# Patient Record
Sex: Female | Born: 1995 | Hispanic: Yes | Marital: Single | Smoking: Never smoker
Health system: Southern US, Community
[De-identification: ages and names within clinical notes are randomized; demographics above are authoritative.]

## PROBLEM LIST (undated history)

## (undated) DIAGNOSIS — N611 Abscess of the breast and nipple: Secondary | ICD-10-CM

## (undated) DIAGNOSIS — O24419 Gestational diabetes mellitus in pregnancy, unspecified control: Secondary | ICD-10-CM

## (undated) HISTORY — DX: Gestational diabetes mellitus in pregnancy, unspecified control: O24.419

## (undated) HISTORY — DX: Abscess of the breast and nipple: N61.1

---
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
## 2016-03-05 NOTE — L&D Delivery Note (Signed)
Delivery Note At 7:16 PM a viable and healthy female was delivered via Vaginal, Spontaneous Delivery (Presentation:ROA ;  ).  APGAR: 9, 9; weight pending .   Placenta status: intact, .  Cord: 3 vessel with the following complications: none  Anesthesia:  epidural Episiotomy: None Lacerations: 1st degree;Labial Suture Repair: 3.0 vicryl Est. Blood Loss (mL): 300  Mom to postpartum.  Baby to Couplet care / Skin to Skin.  Ignacia MarvelKendrick C Chinelo Benn 10/04/2016, 8:04 PM

## 2016-04-30 ENCOUNTER — Other Ambulatory Visit (INDEPENDENT_AMBULATORY_CARE_PROVIDER_SITE_OTHER): Payer: Self-pay

## 2016-04-30 DIAGNOSIS — Z3481 Encounter for supervision of other normal pregnancy, first trimester: Secondary | ICD-10-CM

## 2016-04-30 LAB — POCT URINALYSIS DIPSTICK
Bilirubin, UA: NEGATIVE
GLUCOSE UA: NEGATIVE
Nitrite, UA: NEGATIVE
Protein, UA: NEGATIVE
RBC UA: NEGATIVE
UROBILINOGEN UA: 0.2
pH, UA: 6

## 2016-04-30 LAB — POCT UA - MICROSCOPIC ONLY

## 2016-05-01 LAB — OBSTETRIC PANEL
Antibody Screen: NEGATIVE
BASOS ABS: 0 {cells}/uL (ref 0–200)
Basophils Relative: 0 %
Eosinophils Absolute: 76 cells/uL (ref 15–500)
Eosinophils Relative: 1 %
HCT: 36.5 % (ref 35.0–45.0)
HEP B S AG: NEGATIVE
Hemoglobin: 12.1 g/dL (ref 11.7–15.5)
LYMPHS ABS: 2128 {cells}/uL (ref 850–3900)
Lymphocytes Relative: 28 %
MCH: 28.3 pg (ref 27.0–33.0)
MCHC: 33.2 g/dL (ref 32.0–36.0)
MCV: 85.3 fL (ref 80.0–100.0)
MONO ABS: 608 {cells}/uL (ref 200–950)
MPV: 10.9 fL (ref 7.5–12.5)
Monocytes Relative: 8 %
NEUTROS ABS: 4788 {cells}/uL (ref 1500–7800)
NEUTROS PCT: 63 %
PLATELETS: 206 10*3/uL (ref 140–400)
RBC: 4.28 MIL/uL (ref 3.80–5.10)
RDW: 14.3 % (ref 11.0–15.0)
RH TYPE: POSITIVE
Rubella: <0.9 Index (ref <0.90)
WBC: 7.6 10*3/uL (ref 3.8–10.8)

## 2016-05-01 LAB — HIV ANTIBODY (ROUTINE TESTING W REFLEX): HIV 1&2 Ab, 4th Generation: NONREACTIVE

## 2016-05-01 LAB — CULTURE, OB URINE

## 2016-05-01 LAB — SICKLE CELL SCREEN: SICKLE CELL SCREEN: NEGATIVE

## 2016-05-07 ENCOUNTER — Encounter: Payer: Self-pay | Admitting: Family Medicine

## 2016-05-07 ENCOUNTER — Ambulatory Visit (INDEPENDENT_AMBULATORY_CARE_PROVIDER_SITE_OTHER): Payer: Self-pay | Admitting: Family Medicine

## 2016-05-07 VITALS — BP 108/68 | HR 83 | Temp 98.9°F | Wt 219.4 lb

## 2016-05-07 DIAGNOSIS — Z3400 Encounter for supervision of normal first pregnancy, unspecified trimester: Secondary | ICD-10-CM

## 2016-05-07 NOTE — Progress Notes (Signed)
     Cynthia Moore is a 21 yo G1P0 at unknown gestational age who presents for her initial prenatal visit. She is here today with her fiance.  She notes she is an [employer] patient.   Pregnancy is planned.  She reports breast tenderness, frequent urination and positive home pregnancy test. Reports she is unsure about her LMP but it may be sometime late August.  She  is taking PNV.  Started them about 3 months ago.  Takes them daily.   See flow sheet for details.  PMH: left breast abscess Surg: I+D for left breast abscess in 2015.  Allergies: pollen Medications: PNV Diet : good, well balanced  Exercise: walks around a lot, she is a Production designer, theatre/television/filmmanager at [employer]  Social: No cigarette use, no THC or other drug use.  Social etoh use.  Has not drank during this pregnancy.  Lives with her fiance.  This is her first baby. Feels safe at home.   ROS: Denies shortness of breath, leg swelling, fatigue. Denies abdominal pain.  No burning on urination.    Prenatal Exam: Gen: Well nourished, well developed.  No distress.  Vitals noted and stable.  HEENT: NCAT, MMM, EOMI, PERRL, neck supple without cervical lymphadenopathy, thyromegaly or thyroid nodules.  Fair dentition.   CV: RRR no murmur, gallops or rubs Lungs: CTAB.  Normal respiratory effort without wheezes or rales. Abd: soft, gravid, NTND. +BS.  GU: Deferred.  Ext: No edema or tenderness Psych: Normal grooming and dress.  Not depressed or anxious appearing.  Normal thought content and process without flight of ideas or looseness of associations.  Assessment & Plan: 1) 21 y.o. yo G1P0 at unknown gestational age doing well.  Current pregnancy issues include: needs dating as soon as possible to determine gestational age. Patient reports she went to a facility called [facility] and was ultrasounded.  Was told the baby is a boy but not determined how far along she is in pregnancy.  As dating is not reliable, will send patient for OB ultrasound.      Prenatal labs reviewed, notable for negative RPR, HIV and HbsAg.  GBS Neg. Rubella immune.  A POS, antibody negative.   UA with small leukocytes.  Sent for culture and <10,000 cfu noted.  Patient asymptomatic and no antibiotics necessary.  Genetic screening offered: declined. Early glucola is indicated.  PHQ-9 and Pregnancy Medical Home forms completed and reviewed.  No symptoms of depression.     -Will send patient for OB ultrasound, urgent to obtain accurate dates.  -Glucola not performed at this visit but will need to be done soon as risk factors include Hispanic origin and overweight.   Bleeding and pain precautions reviewed.   Importance of prenatal vitamins reviewed.    Follow up in 1 weeks.  Freddrick MarchYashika Gerrie Castiglia, MD [place] [facility], PGY-1

## 2016-05-07 NOTE — Patient Instructions (Signed)
It was so nice to meet you today! You were seen in clinic for your initial OB visit.  Since we were unable to accurate identify your dates of gestation, I have scheduled for you to get an OB ultrasound.  I will follow up with you once I hear from them regarding the next steps.  Please make an appointment with me to be seen in clinic again in 1 week.    Be well,   Freddrick MarchYashika Sharlot Sturkey, MD

## 2016-05-18 ENCOUNTER — Ambulatory Visit (INDEPENDENT_AMBULATORY_CARE_PROVIDER_SITE_OTHER): Payer: Self-pay | Admitting: Family Medicine

## 2016-05-18 VITALS — BP 132/82 | HR 102 | Temp 98.3°F | Wt 210.0 lb

## 2016-05-18 DIAGNOSIS — Z3480 Encounter for supervision of other normal pregnancy, unspecified trimester: Secondary | ICD-10-CM

## 2016-05-18 LAB — POCT 1 HR PRENATAL GLUCOSE: GLUCOSE 1 HR PRENATAL, POC: 155 mg/dL

## 2016-05-18 NOTE — Patient Instructions (Signed)

## 2016-05-18 NOTE — Progress Notes (Signed)
Cynthia Moore is a 21 y.o. G1P0 at 5266w0d here for routine follow up.  She reports no complaints. See flow sheet for details. Taking prenatal vitamins  A/P: Pregnancy at 5366w0d.  Doing well. Monitor weight as it appears she has lost 9lbs since her last visit (denies N/V). Fundal height and EFW ok. She had an anatomy ultrasound which showed a female with no anomalies noted.  Early 1 hour glucola 155   Pregnancy issues include elevated 1 hour glucola and late to prenatal care. Patient is not interested in genetic screening. Bleeding and pain precautions reviewed. [facility]  Would like an outpatient circumcision. F/u for 3 hr glucola.

## 2016-05-24 ENCOUNTER — Other Ambulatory Visit (INDEPENDENT_AMBULATORY_CARE_PROVIDER_SITE_OTHER): Payer: Self-pay

## 2016-05-24 DIAGNOSIS — Z3482 Encounter for supervision of other normal pregnancy, second trimester: Secondary | ICD-10-CM

## 2016-05-24 LAB — GLUCOSE, POCT (MANUAL RESULT ENTRY): POC Glucose: 99 mg/dl (ref 70–99)

## 2016-05-25 LAB — GESTATIONAL GLUCOSE TOLERANCE
GLUCOSE 1 HOUR GTT: 143 mg/dL (ref 65–179)
GLUCOSE 2 HOUR GTT: 132 mg/dL (ref 65–154)
Glucose, Fasting: 81 mg/dL (ref 65–94)
Glucose, GTT - 3 Hour: 111 mg/dL (ref 65–139)

## 2016-06-06 ENCOUNTER — Encounter: Payer: Self-pay | Admitting: Family Medicine

## 2016-06-28 NOTE — Progress Notes (Deleted)
Cynthia Moore is a 21 y.o. G1P0 at [redacted]w[redacted]d here for routine follow up.  She reports ***. See flow sheet for details.  A/P: Pregnancy at [redacted]w[redacted]d.  Doing well.   Weight today ***.  Fundal height and EFW good.  Pregnancy issues include late to prenatal care and elevated 1 hour glucola with normal early 3 hour GTT. -Will need a one hour glucola today.  Fundal height and EFW good ***.   Preterm labor and fetal movement precautions reviewed.  Follow up *** weeks.

## 2016-07-16 ENCOUNTER — Ambulatory Visit (INDEPENDENT_AMBULATORY_CARE_PROVIDER_SITE_OTHER): Payer: Self-pay | Admitting: Family Medicine

## 2016-07-16 VITALS — BP 120/76 | HR 106 | Temp 98.3°F | Wt 221.2 lb

## 2016-07-16 DIAGNOSIS — Z3400 Encounter for supervision of normal first pregnancy, unspecified trimester: Secondary | ICD-10-CM

## 2016-07-16 MED ORDER — TETANUS-DIPHTH-ACELL PERTUSSIS 5-2.5-18.5 LF-MCG/0.5 IM SUSP: 0.5000 mL | Freq: Once | INTRAMUSCULAR | 0 refills | Status: AC

## 2016-07-16 NOTE — Progress Notes (Signed)
[name] is a 21 y.o. G1P0 at 702w3d here for routine follow up.  She denies vaginal bleeding, discharge, leakage of fluid or abdominal pain.  She feels baby moving.   See flow sheet for details.  A/P: Pregnancy at 262w3d.  Doing well.  She is present today with her family members.  Pregnancy issues include late to prenatal care.  Infant feeding choice: breastfeeding only  Contraception choice: considering Nexplanon  Infant circumcision desired:  Yes, outpatient   Tdap was not given today.  As she is [employer], provided patient with a paper Rx and she will get it done at pharmacy.   CBC, RPR, and HIV done today.   Patient to return for 1-hour glucola as this was not done at today's visit due to time constraint.  Pregnancy medical home and PHQ-9 forms were done today and reviewed.  No concerns on either form.  Rh status was reviewed and patient does not need Rhogam.  Rhogam was not given today.   Childbirth and education classes were offered. Preterm labor and fetal movement precautions reviewed. Patient to make appointment with [facility] as she missed her last appointment with them.  Follow up 2 weeks.

## 2016-07-16 NOTE — Patient Instructions (Addendum)
It was nice seeing you again today.  You were seen in clinic for a prenatal visit and are doing great.  Your baby's heart rate was 152. You had some blood work done today as well and I will follow up with you with the results.    You will need to come back to the lab for your 1 hour glucose test as we were unable to do it during today's visit.   Please schedule an appointment with the OB clinic in 2 weeks for your next visit.   You can make this appointment at the front desk.    Be well,  Freddrick MarchYashika Nathanal Hermiz, MD

## 2016-07-17 LAB — CBC
Hematocrit: 34.2 % (ref 34.0–46.6)
Hemoglobin: 11.5 g/dL (ref 11.1–15.9)
MCH: 29.5 pg (ref 26.6–33.0)
MCHC: 33.6 g/dL (ref 31.5–35.7)
MCV: 88 fL (ref 79–97)
Platelets: 216 10*3/uL (ref 150–379)
RBC: 3.9 x10E6/uL (ref 3.77–5.28)
RDW: 14.3 % (ref 12.3–15.4)
WBC: 9.1 10*3/uL (ref 3.4–10.8)

## 2016-07-17 LAB — RPR: RPR Ser Ql: NONREACTIVE

## 2016-07-17 LAB — HIV ANTIBODY (ROUTINE TESTING W REFLEX): HIV Screen 4th Generation wRfx: NONREACTIVE

## 2016-07-27 ENCOUNTER — Encounter: Payer: Self-pay | Admitting: Family Medicine

## 2016-07-27 ENCOUNTER — Telehealth: Payer: Self-pay | Admitting: Family Medicine

## 2016-07-27 ENCOUNTER — Other Ambulatory Visit (INDEPENDENT_AMBULATORY_CARE_PROVIDER_SITE_OTHER): Payer: Self-pay

## 2016-07-27 DIAGNOSIS — O24419 Gestational diabetes mellitus in pregnancy, unspecified control: Secondary | ICD-10-CM

## 2016-07-27 DIAGNOSIS — O0990 Supervision of high risk pregnancy, unspecified, unspecified trimester: Secondary | ICD-10-CM | POA: Insufficient documentation

## 2016-07-27 DIAGNOSIS — Z3400 Encounter for supervision of normal first pregnancy, unspecified trimester: Secondary | ICD-10-CM

## 2016-07-27 HISTORY — DX: Gestational diabetes mellitus in pregnancy, unspecified control: O24.419

## 2016-07-27 LAB — POCT 1 HR PRENATAL GLUCOSE: Glucose 1 Hr Prenatal, POC: 213 mg/dL

## 2016-07-27 LAB — POCT GLYCOSYLATED HEMOGLOBIN (HGB A1C): HEMOGLOBIN A1C: 5.4

## 2016-07-27 NOTE — Progress Notes (Signed)
?????? ??? ??   5? 4?? ?? ?? ?? ????? ?? ??? ????.  ?? ? ?? ???????? ???????? ??? ?? ???? ?????. ???? ?? ???! ???

## 2016-07-27 NOTE — Telephone Encounter (Signed)
Patient came in today for her 1 hour GTT, which was markedly elevated at 213. As a faculty physician, I was contacted to manage this result while patient was still present in the clinic. Her GTT is diagnostic of gestational diabetes. No indications of preexisting diabetes as she had a normal 3hr GTT earlier this pregnancy. We also did an A1c today, which was 5.4.  I met with patient and discussed dx of GDM, and need for her to transfer her care to high risk OB clinic at [facility]. I gave her some printed information on gestational diabetes to read over. I will enter an urgent referral now. She was advised to continue coming to appts here at the [facility] until she gets in at [facility], so we can be sure she continues to get prenatal care in the interim. She denies cramping/ctx, fluid leaking, vaginal bleeding, or decreased fetal movement.    All questions answered. FYI to Dr. Reesa Chew, primary prenatal provider.  Leeanne Rio, MD

## 2016-08-02 ENCOUNTER — Ambulatory Visit (INDEPENDENT_AMBULATORY_CARE_PROVIDER_SITE_OTHER): Payer: Self-pay | Admitting: Family Medicine

## 2016-08-02 VITALS — BP 110/60 | HR 108 | Temp 98.2°F | Wt 219.0 lb

## 2016-08-02 DIAGNOSIS — Z3400 Encounter for supervision of normal first pregnancy, unspecified trimester: Secondary | ICD-10-CM

## 2016-08-02 NOTE — Progress Notes (Signed)
Cynthia Moore is a 21 y.o. G1P0 at 139w6d here for routine follow up.  She denies vaginal discharge, leakage of fluids and bleeding. Feels baby moving.  Denies cramping and contractions.    See flow sheet for details.  A/P: Pregnancy at 709w6d.  Doing well.   She is present at today's visit with her sister.  Pregnancy issues include late to prenatal care, recently diagnosed gestational diabetes.   Of note, patient had come in for her 1 hour GTT last week which was elevated to 213, diagnostic of gestational diabetes.  Dr. Pollie MeyerMcIntyre had seen her for this as she was present in clinic, and ordered A1c which was 5.4%.  Her diagnosis was discussed and she was referred to [facility] for [facility].  She will [facility] to be [facility] in the interim until she can get [facility].  Referral had been placed and patient also given the phone number to call today.    Infant feeding choice: breastfeeding  Contraception choice: Nexplanon  Infant circumcision desired: yes, outpatient   Tdap : She was given a paper prescription for Tdap at last visit and has received the shot.   Preterm labor and fetal movement precautions reviewed. Safe sleep discussed. Patient is to make appointment with high [facility], however will [facility] in the interim while waiting to establish care.   Follow up in 2 weeks.

## 2016-08-02 NOTE — Patient Instructions (Signed)
It was great seeing you again! You were seen in clinic for your 30 week OB appointment and are doing great.  Your baby's heart rate is 155-160 and your labs from last visit were all normal with the exception of your blood sugar in your 1-hour glucose tolerance test.  You were given the number to call for an appointment at the [facility] for your prenatal care given your diagnosis of gestational diabetes.  I am attaching information about this condition below for you in case you have any questions about it.  In the meantime I would advise you to make your next appointment here with me and we can cancel that if you are able to be seen at [facility].     Be well,  Freddrick March, MD    Gestational Diabetes Mellitus, Diagnosis Gestational diabetes (gestational diabetes mellitus) is a short-term (temporary) form of diabetes that can happen during pregnancy. It goes away after you give birth. It may be caused by one or both of these problems:  Your body does not make enough of a hormone called insulin.  Your body does not respond in a normal way to insulin that it makes.  Insulin lets sugars (glucose) go into cells in the body. This gives you energy. If you have diabetes, sugars cannot get into cells. This causes high blood sugar (hyperglycemia). If diabetes is treated, it may not hurt you or your baby. Your doctor will set treatment goals for you. In general, you should have these blood sugar levels:  After not eating for a long time (fasting): 95 mg/dL (5.3 mmol/L).  After meals (postprandial): ? One hour after a meal: at or below 140 mg/dL (7.8 mmol/L). ? Two hours after a meal: at or below 120 mg/dL (6.7 mmol/L).  A1c (hemoglobin A1c) level: 6-6.5%.  Follow these instructions at home: Questions to Ask Your Doctor  You may want to ask these questions:  Do I need to meet with a diabetes educator?  Where can I find a support group for people with diabetes?  What equipment will  I need to care for myself at home?  What diabetes medicines do I need? When should I take them?  How often do I need to check my blood sugar?  What number can I call if I have questions?  When is my next doctor's visit?  General instructions  Take over-the-counter and prescription medicines only as told by your doctor.  Stay at a healthy weight during pregnancy.  Keep all follow-up visits as told by your doctor. This is important. Contact a doctor if:  Your blood sugar is at or above 240 mg/dL (16.1 mmol/L).  Your blood sugar is at or above 200 mg/dL (09.6 mmol/L) and you have ketones in your pee (urine).  You have been sick or have had a fever for 2 days or more and you are not getting better.  You have any of these problems for more than 6 hours: ? You cannot eat or drink. ? You feel sick to your stomach (nauseous). ? You throw up (vomit). ? You have watery poop (diarrhea). Get help right away if:  Your blood sugar is lower than 54 mg/dL (3 mmol/L).  You get confused.  You have trouble: ? Thinking clearly. ? Breathing.  Your baby moves less than normal.  You have: ? Moderate or large ketone levels in your pee (urine). ? Bleeding from your vagina. ? Unusual fluid coming from your vagina. ? Early contractions.  These may feel like tightness in your belly. This information is not intended to replace advice given to you by your health care provider. Make sure you discuss any questions you have with your health care provider. Document Released: 06/13/2015 Document Revised: 07/28/2015 Document Reviewed: 03/25/2015 Elsevier Interactive Patient Education  [employer]2018 [employer] Inc[employer]

## 2016-08-16 ENCOUNTER — Encounter: Payer: No Typology Code available for payment source | Admitting: Family Medicine

## 2016-08-30 ENCOUNTER — Other Ambulatory Visit: Payer: No Typology Code available for payment source

## 2016-09-04 ENCOUNTER — Encounter: Payer: Self-pay | Admitting: Obstetrics and Gynecology

## 2016-09-04 NOTE — Progress Notes (Deleted)
@  NAME@ is a @AGE @ @GP @ at @GA @ here for routine follow up.  She reports ***. See flow sheet for details.  A/P: Pregnancy at @GA @.  Doing well.   Pregnancy issues include ***.  Infant feeding choice: *** Contraception choice: *** Infant circumcision desired: {Response; yes/no/na:63}  Tdap {was/was WUJ:81191}not:19854} given today. GBS and gc/chlamydia testing {was/was not:19854} performed today.  Preterm labor and fetal movement precautions reviewed. Safe sleep discussed. Follow up {1/2:30633} week(s).  Caryl AdaJazma Phelps, DO 09/04/2016, 9:21 AM

## 2016-09-11 ENCOUNTER — Ambulatory Visit (INDEPENDENT_AMBULATORY_CARE_PROVIDER_SITE_OTHER): Payer: Self-pay | Admitting: Family Medicine

## 2016-09-11 ENCOUNTER — Other Ambulatory Visit (HOSPITAL_COMMUNITY)
Admission: RE | Admit: 2016-09-11 | Payer: No Typology Code available for payment source | Source: Ambulatory Visit | Admitting: Family Medicine

## 2016-09-11 VITALS — BP 108/80 | HR 112 | Temp 98.1°F | Wt 226.0 lb

## 2016-09-11 DIAGNOSIS — Z3403 Encounter for supervision of normal first pregnancy, third trimester: Secondary | ICD-10-CM

## 2016-09-11 DIAGNOSIS — O24419 Gestational diabetes mellitus in pregnancy, unspecified control: Secondary | ICD-10-CM

## 2016-09-11 LAB — GLUCOSE, POCT (MANUAL RESULT ENTRY): POC Glucose: 120 mg/dl — AB (ref 70–99)

## 2016-09-11 NOTE — Progress Notes (Signed)
[name] is a 21 y.o. G1P0 at 745w4d here for routine follow up.  She reports cramping in the evenings, started at 9pm x 1.5 hours constant. "Not good with pain." Discharge - white gooey discharge with every wipe. Noted one brown episode of discharge 2 days ago. No blood. Baby is moving well.   See flow sheet for details.  A/P: Pregnancy at 3245w4d.  Doing well.   Pregnancy issues include gestational diabetes - [facility].  Infant feeding choice: breastfeeding  Contraception choice: Nexplanon Infant circumcision desired: yes  Tdap was not given today. Already given.  GBS and gc/chlamydia testing was performed today. Discharge - on exam, physiologic discharge of pregnancy.  GDM - patient not scheduled until 7/19. [facility]eds [facility], needing diabetic teaching and CBG monitoring. POCT CBG today 120 3.5 hours after eating. Discussed with Dr. Adrian BlackwaterStinson who [facility] schedule her an appt for tomorrow for teaching and OB visit Thursday. Given one touch glucometer, 10 lancets, and 10 strips today. RN completed teaching for CBG checks.   Preterm labor and fetal movement precautions reviewed. Safe sleep discussed. Follow up with [facility].

## 2016-09-12 ENCOUNTER — Ambulatory Visit (INDEPENDENT_AMBULATORY_CARE_PROVIDER_SITE_OTHER): Payer: Self-pay | Admitting: Obstetrics and Gynecology

## 2016-09-12 ENCOUNTER — Encounter: Payer: Self-pay | Admitting: Obstetrics and Gynecology

## 2016-09-12 VITALS — BP 121/72 | HR 79 | Wt 225.0 lb

## 2016-09-12 DIAGNOSIS — O99213 Obesity complicating pregnancy, third trimester: Secondary | ICD-10-CM | POA: Insufficient documentation

## 2016-09-12 DIAGNOSIS — O0990 Supervision of high risk pregnancy, unspecified, unspecified trimester: Secondary | ICD-10-CM

## 2016-09-12 DIAGNOSIS — Z113 Encounter for screening for infections with a predominantly sexual mode of transmission: Secondary | ICD-10-CM

## 2016-09-12 DIAGNOSIS — O24419 Gestational diabetes mellitus in pregnancy, unspecified control: Secondary | ICD-10-CM

## 2016-09-12 DIAGNOSIS — E669 Obesity, unspecified: Secondary | ICD-10-CM | POA: Insufficient documentation

## 2016-09-12 DIAGNOSIS — O0993 Supervision of high risk pregnancy, unspecified, third trimester: Secondary | ICD-10-CM

## 2016-09-12 NOTE — Progress Notes (Signed)
Prenatal Visit Note Date: 09/12/2016 Clinic: [facility] 09/12/2016  CC: transfer of care for GDM  Subjective:  Cynthia Moore is a 21 y.o. G1 at 1330w5d being seen today for ongoing prenatal care.  She is currently monitored for the following issues for this high-risk pregnancy and has Gestational diabetes mellitus (GDM); Supervision of high risk pregnancy, antepartum; Obesity in pregnancy, antepartum, third trimester; and Obesity (BMI 30-39.9) on her problem list.  Patient reports no complaints.   Contractions: Not present. Vag. Bleeding: None.  Movement: Present. Denies leaking of fluid.   The following portions of the patient's history were reviewed and updated as appropriate: allergies, current medications, past family history, past medical history, past social history, past surgical history and problem list. Problem list updated.  Objective:   Vitals:   09/12/16 1353  BP: 121/72  Pulse: 79  Weight: 225 lb (102.1 kg)    Fetal Status: Fetal Heart Rate (bpm): 145 Fundal Height: 37 cm Movement: Present  Presentation: Vertex  General:  Alert, oriented and cooperative. Patient is in no acute distress.  Skin: Skin is warm and dry. No rash noted.   Cardiovascular: Normal heart rate noted  Respiratory: Normal respiratory effort, no problems with respiration noted  Abdomen: Soft, gravid, appropriate for gestational age. Pain/Pressure: Present     Pelvic:  Cervical exam deferred        Extremities: Normal range of motion.  Edema: Trace  Mental Status: Normal mood and affect. Normal behavior. Normal judgment and thought content.   Urinalysis:      Assessment and Plan:  Pregnancy: G1P0 at 1330w5d  1. Supervision of high risk pregnancy, antepartum Routine care. F/u GBS from yesterday. D/w pt re: BC nv.  - GC/Chlamydia probe amp ([place])not at [facility]  2. Gestational diabetes mellitus (GDM), antepartum, gestational diabetes method of control unspecified Pt  diagnosed in late May; see ROB from Family Medicine yesterday. Only BS value is a 2hr PP of 113. a1c back at dx was 5.3. Pt has education appt next; pt given tips today and told to keep a food log and bring it to visit next week. Surveillance growth u/s ordered - US MFM OB DETAIL +14 WK; Future  Preterm labor symptoms and general obstetric precautions including but not limited to vaginal bleeding, contractions, leaking of fluid and fetal movement were reviewed in detail with the patient. Please refer to After Visit Summary for other counseling recommendations.  Return in about 1 week (around 09/19/2016) for 7-10d rob.   Cynthia BingPickens, Emmabelle Fear, MD

## 2016-09-13 ENCOUNTER — Ambulatory Visit: Payer: Self-pay | Admitting: *Deleted

## 2016-09-13 ENCOUNTER — Other Ambulatory Visit: Payer: Self-pay | Admitting: *Deleted

## 2016-09-13 ENCOUNTER — Encounter: Payer: No Typology Code available for payment source | Attending: Obstetrics and Gynecology | Admitting: *Deleted

## 2016-09-13 DIAGNOSIS — O24410 Gestational diabetes mellitus in pregnancy, diet controlled: Secondary | ICD-10-CM | POA: Insufficient documentation

## 2016-09-13 DIAGNOSIS — Z3A00 Weeks of gestation of pregnancy not specified: Secondary | ICD-10-CM | POA: Insufficient documentation

## 2016-09-13 DIAGNOSIS — Z713 Dietary counseling and surveillance: Secondary | ICD-10-CM | POA: Insufficient documentation

## 2016-09-13 LAB — GC/CHLAMYDIA PROBE AMP (~~LOC~~) NOT AT ARMC
Chlamydia: NEGATIVE
NEISSERIA GONORRHEA: NEGATIVE

## 2016-09-13 LAB — CERVICOVAGINAL ANCILLARY ONLY
CHLAMYDIA, DNA PROBE: NEGATIVE
NEISSERIA GONORRHEA: NEGATIVE

## 2016-09-13 NOTE — Progress Notes (Signed)
  Patient was seen on 09/13/2016 for Gestational Diabetes self-management. Patient was given One Touch Verio meter 2 days ago but does not have insurance to cover cost of supplies. She has been testing and states her FBG today was 80 and post meal after lunch yesterday was 113 mg/dl. Diet history indicated high carb meals until diagnosis of GDM.  The following learning objectives were met by the patient :   States the definition of Gestational Diabetes  States why dietary management is important in controlling blood glucose  Describes the effects of carbohydrates on blood glucose levels  Demonstrates ability to create a balanced meal plan  Demonstrates carbohydrate counting   States when to check blood glucose levels  Demonstrates proper blood glucose monitoring techniques  States the effect of stress and exercise on blood glucose levels  States the importance of limiting caffeine and abstaining from alcohol and smoking  Plan:  Aim for 3 Carb Choices per meal (45 grams) +/- 1 either way  Aim for 1-2 Carbs per snack Begin reading food labels for Total Carbohydrate of foods Consider  increasing your activity level by walking or other activity daily as tolerated Begin checking BG before breakfast and 2 hours after first bite of breakfast, lunch and dinner as directed by MD  Bring Log Book to every medical appointment   Take medication if directed by MD  Blood glucose monitor given: True Track Lot # [id number] Exp: 06/23/2018 Blood glucose reading: not done today as she is already testing and all results so far within target ranges.   Patient instructed to monitor glucose levels: FBS: 60 - 95 mg/dl 2 hour: <120 mg/dl  Patient received the following handouts:  Nutrition Diabetes and Pregnancy  Carbohydrate Counting List  Patient will be seen for follow-up as needed.

## 2016-09-14 ENCOUNTER — Encounter: Payer: Self-pay | Admitting: *Deleted

## 2016-09-14 NOTE — Progress Notes (Signed)
Addendum: 09/14/16 10:40:  FMLA paperwork completed.

## 2016-09-15 LAB — CULTURE, BETA STREP (GROUP B ONLY): STREP GP B CULTURE: NEGATIVE

## 2016-09-17 ENCOUNTER — Other Ambulatory Visit: Payer: Self-pay | Admitting: Obstetrics and Gynecology

## 2016-09-17 ENCOUNTER — Ambulatory Visit (HOSPITAL_COMMUNITY)
Admission: RE | Admit: 2016-09-17 | Discharge: 2016-09-17 | Disposition: A | Discharge status: Home / Self Care | Payer: No Typology Code available for payment source | Source: Ambulatory Visit | Attending: Obstetrics and Gynecology | Admitting: Obstetrics and Gynecology

## 2016-09-17 ENCOUNTER — Encounter (HOSPITAL_COMMUNITY): Payer: Self-pay

## 2016-09-17 DIAGNOSIS — Z363 Encounter for antenatal screening for malformations: Secondary | ICD-10-CM

## 2016-09-17 DIAGNOSIS — O99213 Obesity complicating pregnancy, third trimester: Secondary | ICD-10-CM

## 2016-09-17 DIAGNOSIS — O24419 Gestational diabetes mellitus in pregnancy, unspecified control: Secondary | ICD-10-CM

## 2016-09-17 DIAGNOSIS — O24410 Gestational diabetes mellitus in pregnancy, diet controlled: Secondary | ICD-10-CM | POA: Insufficient documentation

## 2016-09-17 DIAGNOSIS — Z3A37 37 weeks gestation of pregnancy: Secondary | ICD-10-CM

## 2016-09-19 ENCOUNTER — Ambulatory Visit (INDEPENDENT_AMBULATORY_CARE_PROVIDER_SITE_OTHER): Payer: Self-pay | Admitting: Obstetrics & Gynecology

## 2016-09-19 VITALS — BP 113/64 | HR 92 | Wt 226.4 lb

## 2016-09-19 DIAGNOSIS — O0993 Supervision of high risk pregnancy, unspecified, third trimester: Secondary | ICD-10-CM

## 2016-09-19 DIAGNOSIS — O24410 Gestational diabetes mellitus in pregnancy, diet controlled: Secondary | ICD-10-CM

## 2016-09-19 DIAGNOSIS — O0990 Supervision of high risk pregnancy, unspecified, unspecified trimester: Secondary | ICD-10-CM

## 2016-09-19 LAB — POCT URINALYSIS DIP (DEVICE)
BILIRUBIN URINE: NEGATIVE
Glucose, UA: NEGATIVE mg/dL
HGB URINE DIPSTICK: NEGATIVE
NITRITE: NEGATIVE
Protein, ur: 30 mg/dL — AB
Specific Gravity, Urine: 1.025 (ref 1.005–1.030)
Urobilinogen, UA: 0.2 mg/dL (ref 0.0–1.0)
pH: 7 (ref 5.0–8.0)

## 2016-09-19 NOTE — Progress Notes (Signed)
PRENATAL VISIT NOTE  Subjective:  Cynthia Moore is a 21 y.o. G1P0 at 6627w5d being seen today for ongoing prenatal care.  She is currently monitored for the following issues for this high-risk pregnancy and has Gestational diabetes mellitus (GDM); Supervision of high risk pregnancy, antepartum; Obesity in pregnancy, antepartum, third trimester; and Obesity (BMI 30-39.9) on her problem list.  Patient reports no complaints.  Contractions: Not present. Vag. Bleeding: None.  Movement: Present. Denies leaking of fluid.   The following portions of the patient's history were reviewed and updated as appropriate: allergies, current medications, past family history, past medical history, past social history, past surgical history and problem list. Problem list updated.  Objective:   Vitals:   09/19/16 1251  BP: 113/64  Pulse: 92  Weight: 226 lb 6.4 oz (102.7 kg)    Fetal Status: Fetal Heart Rate (bpm): 135 Fundal Height: 39 cm Movement: Present     General:  Alert, oriented and cooperative. Patient is in no acute distress.  Skin: Skin is warm and dry. No rash noted.   Cardiovascular: Normal heart rate noted  Respiratory: Normal respiratory effort, no problems with respiration noted  Abdomen: Soft, gravid, appropriate for gestational age.  Pain/Pressure: Present     Pelvic: Cervical exam deferred        Extremities: Normal range of motion.  Edema: None  Mental Status:  Normal mood and affect. Normal behavior. Normal judgment and thought content.   [place] Mfm Ob Detail +14 Wk  Result Date: 09/17/2016 ----------------------------------------------------------------------  OBSTETRICS REPORT                      (Signed Final 09/17/2016 05:43 pm) ---------------------------------------------------------------------- Patient Info  ID #:       [id number]                         D.O.B.:   Feb 12, 1996 (20 yrs)  Name:       Cynthia Moore                 Visit Date:  09/17/2016 10:29 am  ---------------------------------------------------------------------- Performed By  Performed By:     Tommie RaymondMesha Tester BS,       Secondary Phy.:   CHARLIE                    RDMS, RVT                                                             Vergie LivingPICKENS MD  Attending:        Particia NearingMartha Decker MD       Address:          [address]                                                             [address]  [place], [place]                                                             [address]  Referred By:      [facility]       Location:         [facility]                    [facility]                    Healthcare  Ref. Address:     [facility]                    [address]                    [place], [place]                    [address] ---------------------------------------------------------------------- Orders   #  Description                                 Code   1  [place] MFM OB DETAIL +14 WK                     82956.21  ----------------------------------------------------------------------   #  Ordered By               Order #        Accession #    Episode #   1   Bing          [id number]      [id number]     [id number]  ---------------------------------------------------------------------- Indications   [redacted] weeks gestation of pregnancy                Z3A.37   Obesity complicating pregnancy, third          O99.213   trimester   Antenatal screening for malformations          Z36.3   Gestational diabetes in pregnancy, diet        O24.410   controlled  ---------------------------------------------------------------------- OB History  Blood Type:            Height:  5'4"   Weight (lb):  227      BMI:   38.96  Gravidity:    1         Term:   0        Prem:   0        SAB:   0  TOP:          0       Ectopic:  0        Living: 0  ---------------------------------------------------------------------- Fetal Evaluation  Num Of Fetuses:     1  Fetal  Heart         171  Rate(bpm):  Cardiac Activity:   Observed  Presentation:       Cephalic  Placenta:           Anterior, above cervical os  P. Cord Insertion:  Visualized, central  Amniotic Fluid  AFI FV:      Subjectively within normal limits  AFI Sum(cm)     %Tile       Largest Pocket(cm)  16.59           63          5.26  RUQ(cm)       RLQ(cm)       LUQ(cm)        LLQ(cm)  5.26          3.37          4.14           3.82 ---------------------------------------------------------------------- Biometry  BPD:      97.7  mm     G. Age:  40w 0d       > 99  %    CI:        84.46   %   70 - 86                                                          FL/HC:      20.5   %   20.8 - 22.6  HC:       335   mm     G. Age:  38w 2d         49  %    HC/AC:      0.97       0.92 - 1.05  AC:      343.9  mm     G. Age:  38w 2d         84  %    FL/BPD:     70.2   %   71 - 87  FL:       68.6  mm     G. Age:  35w 1d          8  %    FL/AC:      19.9   %   20 - 24  HUM:      61.8  mm     G. Age:  35w 5d         43  %  Est. FW:    3312  gm      7 lb 5 oz     79  % ---------------------------------------------------------------------- Gestational Age  Clinical EDD:  37w 3d                                        EDD:   10/05/16  U/S Today:     38w 0d                                        EDD:   10/01/16  Best:  37w 3d    Det. By:   Previous Ultrasound      EDD:   10/05/16                                      (05/14/16) ---------------------------------------------------------------------- Anatomy  Cranium:               Appears normal         Aortic Arch:            Not well visualized  Cavum:                 Appears normal         Ductal Arch:            Not well visualized  Ventricles:            Appears normal         Diaphragm:              Appears normal  Choroid Plexus:        Appears normal         Stomach:                 Appears normal, left                                                                        sided  Cerebellum:            Not well visualized    Abdomen:                Appears normal  Posterior Fossa:       Not well visualized    Abdominal Wall:         Not well visualized  Nuchal Fold:           Not applicable (>20    Cord Vessels:           Appears normal ([redacted]                         wks GA)                                        vessel cord)  Face:                  Orbits nl; profile not Kidneys:                Appear normal                         well visualized  Lips:                  Appears normal         Bladder:                Appears normal  Thoracic:              Appears normal  Spine:                  Limited views                                                                        appear normal  Heart:                 Appears normal         Upper Extremities:      Visualized                         (4CH, axis, and situs  RVOT:                  Appears normal         Lower Extremities:      Visualized  LVOT:                  Appears normal  Other:  Fetus appears to be a female. Technically difficult due to advanced GA          and fetal position. Technically difficult due to maternal habitus. ---------------------------------------------------------------------- Cervix Uterus Adnexa  Cervix  Not visualized (advanced GA >29wks)  Uterus  No abnormality visualized.  Left Ovary  Size(cm)     2.71  x    2.84   x  1.68      Vol(ml): 6.8  Within normal limits. No adnexal mass visualized.  Right Ovary  Size(cm)     2.79  x    2.31   x  2.58      Vol(ml): 8.7  Within normal limits. No adnexal mass visualized.  Cul De Sac:   No free fluid seen.  Adnexa:       No abnormality visualized. ---------------------------------------------------------------------- Impression  SIUP at 37+3 weeks  Cephalic presentation  Normal detailed fetal anatomy; limited views of posterior  fossa, profile, arches and  spine  Normal amniotic fluid volume  Measurements consistent with prior US; EFW at the 79th  %tile ---------------------------------------------------------------------- Recommendations  Follow-up as clinically indicated ----------------------------------------------------------------------                 Particia Nearing, MD Electronically Signed Final Report   09/17/2016 05:43 pm ----------------------------------------------------------------------  Results for orders placed or performed in visit on 09/19/16 (from the past 336 hour(s))  POCT urinalysis dip (device)   Collection Time: 09/19/16 12:57 PM  Result Value Ref Range   Glucose, UA NEGATIVE NEGATIVE mg/dL   Bilirubin Urine NEGATIVE NEGATIVE   Ketones, ur TRACE (A) NEGATIVE mg/dL   Specific Gravity, Urine 1.025 1.005 - 1.030   Hgb urine dipstick NEGATIVE NEGATIVE   pH 7.0 5.0 - 8.0   Protein, ur 30 (A) NEGATIVE mg/dL   Urobilinogen, UA 0.2 0.0 - 1.0 mg/dL   Nitrite NEGATIVE NEGATIVE   Leukocytes, UA SMALL (A) NEGATIVE  Results for orders placed or performed in visit on 09/12/16 (from the past 336 hour(s))  GC/Chlamydia probe amp ([place])not at [facility]   Collection Time: 09/12/16 12:00 AM  Result Value Ref Range   Chlamydia Negative    Neisseria gonorrhea Negative   Results for orders placed or performed in  visit on 09/11/16 (from the past 336 hour(s))  Cervicovaginal ancillary only   Collection Time: 09/11/16 12:00 AM  Result Value Ref Range   Chlamydia Negative    Neisseria gonorrhea Negative   Glucose (CBG)   Collection Time: 09/11/16  3:28 PM  Result Value Ref Range   POC Glucose 120 (A) 70 - 99 mg/dl  Culture, beta strep (group b only)   Collection Time: 09/11/16  3:56 PM  Result Value Ref Range   Strep Gp B Culture Negative Negative     Assessment and Plan:  Pregnancy: G1P0 at [redacted]w[redacted]d  1. Diet controlled gestational diabetes mellitus (GDM) in third trimester Stable blood sugars, continue diet and exercise. EFW  79%.    2. Supervision of high risk pregnancy, antepartum Term labor symptoms and general obstetric precautions including but not limited to vaginal bleeding, contractions, leaking of fluid and fetal movement were reviewed in detail with the patient. Please refer to After Visit Summary for other counseling recommendations.  Return in about 1 week (around 09/26/2016) for OB Visit.   Jaynie Collins, MD

## 2016-09-19 NOTE — Patient Instructions (Signed)
Regrese a la clinica cuando tenga su cita. Si tiene problemas o preguntas, llama a la clinica o vaya a la sala de emergencia al [facility].    

## 2016-09-20 ENCOUNTER — Encounter (HOSPITAL_COMMUNITY): Payer: Self-pay

## 2016-09-20 ENCOUNTER — Other Ambulatory Visit: Payer: No Typology Code available for payment source

## 2016-09-26 ENCOUNTER — Telehealth (HOSPITAL_COMMUNITY): Payer: Self-pay | Admitting: *Deleted

## 2016-09-26 ENCOUNTER — Ambulatory Visit (INDEPENDENT_AMBULATORY_CARE_PROVIDER_SITE_OTHER): Payer: Self-pay | Admitting: Obstetrics and Gynecology

## 2016-09-26 VITALS — BP 104/64 | HR 76 | Wt 230.0 lb

## 2016-09-26 DIAGNOSIS — E669 Obesity, unspecified: Secondary | ICD-10-CM

## 2016-09-26 DIAGNOSIS — O0993 Supervision of high risk pregnancy, unspecified, third trimester: Secondary | ICD-10-CM

## 2016-09-26 DIAGNOSIS — O99213 Obesity complicating pregnancy, third trimester: Secondary | ICD-10-CM

## 2016-09-26 DIAGNOSIS — O0990 Supervision of high risk pregnancy, unspecified, unspecified trimester: Secondary | ICD-10-CM

## 2016-09-26 DIAGNOSIS — O24410 Gestational diabetes mellitus in pregnancy, diet controlled: Secondary | ICD-10-CM

## 2016-09-26 NOTE — Progress Notes (Signed)
Prenatal Visit Note Date: 09/26/2016 Clinic: [facility]  Subjective:  Cynthia Moore is a 21 y.o. G1P0 at 3619w5d being seen today for ongoing prenatal care.  She is currently monitored for the following issues for this high-risk pregnancy and has Gestational diabetes mellitus (GDM); Supervision of high risk pregnancy, antepartum; Obesity in pregnancy, antepartum, third trimester; and Obesity (BMI 30-39.9) on her problem list.  Patient reports no complaints.   Contractions: Irregular. Vag. Bleeding: None.  Movement: Present. Denies leaking of fluid.   The following portions of the patient's history were reviewed and updated as appropriate: allergies, current medications, past family history, past medical history, past social history, past surgical history and problem list. Problem list updated.  Objective:   Vitals:   09/26/16 1252  BP: 104/64  Pulse: 76  Weight: 230 lb (104.3 kg)    Fetal Status: Fetal Heart Rate (bpm): 150 Fundal Height: 39 cm Movement: Present  Presentation: Vertex  General:  Alert, oriented and cooperative. Patient is in no acute distress.  Skin: Skin is warm and dry. No rash noted.   Cardiovascular: Normal heart rate noted  Respiratory: Normal respiratory effort, no problems with respiration noted  Abdomen: Soft, gravid, appropriate for gestational age. Pain/Pressure: Present     Pelvic:  Cervical exam deferred Dilation: 1 Effacement (%): 50 Station: -3/posterior/intermediate Pelvis feels adequate  Extremities: Normal range of motion.  Edema: Trace  Mental Status: Normal mood and affect. Normal behavior. Normal judgment and thought content.   Urinalysis:      Assessment and Plan:  Pregnancy: G1P0 at 2319w5d  1. Obesity (BMI 30-39.9) No issues  2. Obesity in pregnancy, antepartum, third trimester No issues  3. Supervision of high risk pregnancy, antepartum Routine care. GBS neg  4. Diet controlled gestational diabetes mellitus  (GDM) in third trimester Normal BS log today. 7/16: efw 79%, 3312gm, AC 84% BPD >99% with normal AFI, ceph. Set up for IOL at 7am at 39/6 (no other spots available)  Term labor symptoms and general obstetric precautions including but not limited to vaginal bleeding, contractions, leaking of fluid and fetal movement were reviewed in detail with the patient. Please refer to After Visit Summary for other counseling recommendations.  Return in about 1 week (around 10/03/2016) for rob.   [place] BingPickens, Myha Arizpe, MD

## 2016-09-26 NOTE — Progress Notes (Signed)
Induction scheduled for 7:30 am on 08/02.

## 2016-09-26 NOTE — Telephone Encounter (Signed)
Preadmission screen  

## 2016-10-02 ENCOUNTER — Inpatient Hospital Stay (HOSPITAL_COMMUNITY)
Admission: AD | Admit: 2016-10-02 | Discharge: 2016-10-02 | Disposition: A | Discharge status: Home / Self Care | Payer: No Typology Code available for payment source | Source: Ambulatory Visit | Attending: Family Medicine | Admitting: Family Medicine

## 2016-10-02 ENCOUNTER — Encounter (HOSPITAL_COMMUNITY): Payer: Self-pay | Admitting: *Deleted

## 2016-10-02 DIAGNOSIS — O471 False labor at or after 37 completed weeks of gestation: Secondary | ICD-10-CM | POA: Insufficient documentation

## 2016-10-02 DIAGNOSIS — O479 False labor, unspecified: Secondary | ICD-10-CM

## 2016-10-02 DIAGNOSIS — Z3A39 39 weeks gestation of pregnancy: Secondary | ICD-10-CM | POA: Insufficient documentation

## 2016-10-02 NOTE — MAU Note (Signed)
Smith,CNM/White, MD notified of no vaginal bleeding.

## 2016-10-02 NOTE — MAU Note (Signed)
Pt reports she has had light bleeding for 3 days, contractions and pelvic pain. Reports good fetal movement.

## 2016-10-02 NOTE — MAU Note (Signed)
Dr Cliffton AstersWhite notified of no/minimal change in cervical exam (1- loose 1)

## 2016-10-03 ENCOUNTER — Other Ambulatory Visit: Payer: Self-pay | Admitting: Advanced Practice Midwife

## 2016-10-04 ENCOUNTER — Inpatient Hospital Stay (HOSPITAL_COMMUNITY)
Admission: AD | Admit: 2016-10-04 | Discharge: 2016-10-06 | DRG: 775 | Disposition: A | Discharge status: Home / Self Care | Payer: Medicaid Other | Source: Ambulatory Visit | Attending: Obstetrics & Gynecology | Admitting: Obstetrics & Gynecology

## 2016-10-04 ENCOUNTER — Inpatient Hospital Stay (HOSPITAL_COMMUNITY): Admission: RE | Admit: 2016-10-04 | Payer: No Typology Code available for payment source | Source: Ambulatory Visit

## 2016-10-04 ENCOUNTER — Encounter: Payer: No Typology Code available for payment source | Admitting: Advanced Practice Midwife

## 2016-10-04 ENCOUNTER — Inpatient Hospital Stay (HOSPITAL_COMMUNITY): Payer: Medicaid Other | Admitting: Anesthesiology

## 2016-10-04 ENCOUNTER — Encounter (HOSPITAL_COMMUNITY): Payer: Self-pay

## 2016-10-04 DIAGNOSIS — O24420 Gestational diabetes mellitus in childbirth, diet controlled: Secondary | ICD-10-CM | POA: Diagnosis present

## 2016-10-04 DIAGNOSIS — O99214 Obesity complicating childbirth: Secondary | ICD-10-CM | POA: Diagnosis present

## 2016-10-04 DIAGNOSIS — Z3A39 39 weeks gestation of pregnancy: Secondary | ICD-10-CM

## 2016-10-04 DIAGNOSIS — O24429 Gestational diabetes mellitus in childbirth, unspecified control: Secondary | ICD-10-CM

## 2016-10-04 DIAGNOSIS — Z6839 Body mass index (BMI) 39.0-39.9, adult: Secondary | ICD-10-CM | POA: Diagnosis not present

## 2016-10-04 LAB — COMPREHENSIVE METABOLIC PANEL
ALBUMIN: 3 g/dL — AB (ref 3.5–5.0)
ALK PHOS: 157 U/L — AB (ref 38–126)
ALT: 9 U/L — AB (ref 14–54)
ANION GAP: 10 (ref 5–15)
AST: 16 U/L (ref 15–41)
BILIRUBIN TOTAL: 0.3 mg/dL (ref 0.3–1.2)
BUN: 11 mg/dL (ref 6–20)
CALCIUM: 9.2 mg/dL (ref 8.9–10.3)
CO2: 18 mmol/L — ABNORMAL LOW (ref 22–32)
CREATININE: 0.49 mg/dL (ref 0.44–1.00)
Chloride: 107 mmol/L (ref 101–111)
GFR calc non Af Amer: >60 mL/min (ref >60)
GLUCOSE: 85 mg/dL (ref 65–99)
Potassium: 3.7 mmol/L (ref 3.5–5.1)
Sodium: 135 mmol/L (ref 135–145)
TOTAL PROTEIN: 6.3 g/dL — AB (ref 6.5–8.1)

## 2016-10-04 LAB — TYPE AND SCREEN
ABO/RH(D): A POS
Antibody Screen: NEGATIVE

## 2016-10-04 LAB — PROTEIN / CREATININE RATIO, URINE
Creatinine, Urine: 43 mg/dL
Total Protein, Urine: <6 mg/dL

## 2016-10-04 LAB — CBC
HCT: 37.8 % (ref 36.0–46.0)
Hemoglobin: 12.2 g/dL (ref 12.0–15.0)
MCH: 26.9 pg (ref 26.0–34.0)
MCHC: 32.3 g/dL (ref 30.0–36.0)
MCV: 83.4 fL (ref 78.0–100.0)
PLATELETS: 174 10*3/uL (ref 150–400)
RBC: 4.53 MIL/uL (ref 3.87–5.11)
RDW: 14.5 % (ref 11.5–15.5)
WBC: 8.9 10*3/uL (ref 4.0–10.5)

## 2016-10-04 LAB — GLUCOSE, CAPILLARY
GLUCOSE-CAPILLARY: 65 mg/dL (ref 65–99)
Glucose-Capillary: 97 mg/dL (ref 65–99)
Glucose-Capillary: 89 mg/dL (ref 65–99)

## 2016-10-04 LAB — RPR: RPR: NONREACTIVE

## 2016-10-04 LAB — POCT FERN TEST: POCT Fern Test: POSITIVE

## 2016-10-04 LAB — ABO/RH: ABO/RH(D): A POS

## 2016-10-04 MED ORDER — SIMETHICONE 80 MG PO CHEW: 80.0000 mg | CHEWABLE_TABLET | ORAL | Status: DC | PRN

## 2016-10-04 MED ORDER — DIPHENHYDRAMINE HCL 25 MG PO CAPS: 25.0000 mg | ORAL_CAPSULE | Freq: Four times a day (QID) | ORAL | Status: DC | PRN

## 2016-10-04 MED ORDER — FENTANYL CITRATE (PF) 100 MCG/2ML IJ SOLN
100.0000 ug | INTRAMUSCULAR | Status: DC | PRN
  Administered 2016-10-04: 100 ug via INTRAVENOUS
  Filled 2016-10-04: qty 2

## 2016-10-04 MED ORDER — FENTANYL 2.5 MCG/ML BUPIVACAINE 1/10 % EPIDURAL INFUSION (WH - ANES)
14.0000 mL/h | INTRAMUSCULAR | Status: DC | PRN
  Administered 2016-10-04: 14 mL/h via EPIDURAL
  Filled 2016-10-04: qty 100

## 2016-10-04 MED ORDER — OXYCODONE-ACETAMINOPHEN 5-325 MG PO TABS: 1.0000 | ORAL_TABLET | ORAL | Status: DC | PRN

## 2016-10-04 MED ORDER — MISOPROSTOL 200 MCG PO TABS
ORAL_TABLET | ORAL | Status: AC
  Administered 2016-10-04: 800 ug via RECTAL
  Filled 2016-10-04: qty 4

## 2016-10-04 MED ORDER — TERBUTALINE SULFATE 1 MG/ML IJ SOLN
0.2500 mg | Freq: Once | INTRAMUSCULAR | Status: DC | PRN
  Filled 2016-10-04: qty 1

## 2016-10-04 MED ORDER — MISOPROSTOL 25 MCG QUARTER TABLET: 25.0000 ug | ORAL_TABLET | ORAL | Status: DC | PRN

## 2016-10-04 MED ORDER — ONDANSETRON HCL 4 MG/2ML IJ SOLN: 4.0000 mg | Freq: Four times a day (QID) | INTRAMUSCULAR | Status: DC | PRN

## 2016-10-04 MED ORDER — SOD CITRATE-CITRIC ACID 500-334 MG/5ML PO SOLN: 30.0000 mL | ORAL | Status: DC | PRN

## 2016-10-04 MED ORDER — WITCH HAZEL-GLYCERIN EX PADS: 1.0000 "application " | MEDICATED_PAD | CUTANEOUS | Status: DC | PRN

## 2016-10-04 MED ORDER — DIPHENHYDRAMINE HCL 50 MG/ML IJ SOLN: 12.5000 mg | INTRAMUSCULAR | Status: DC | PRN

## 2016-10-04 MED ORDER — EPHEDRINE 5 MG/ML INJ
10.0000 mg | INTRAVENOUS | Status: DC | PRN
  Filled 2016-10-04: qty 2

## 2016-10-04 MED ORDER — IBUPROFEN 600 MG PO TABS
600.0000 mg | ORAL_TABLET | Freq: Four times a day (QID) | ORAL | Status: DC
  Administered 2016-10-04 – 2016-10-06 (×8): 600 mg via ORAL
  Filled 2016-10-04 (×8): qty 1

## 2016-10-04 MED ORDER — LACTATED RINGERS IV SOLN: INTRAVENOUS | Status: DC

## 2016-10-04 MED ORDER — MISOPROSTOL 200 MCG PO TABS
800.0000 ug | ORAL_TABLET | Freq: Once | ORAL | Status: AC
  Administered 2016-10-04: 800 ug via RECTAL

## 2016-10-04 MED ORDER — ACETAMINOPHEN 325 MG PO TABS: 650.0000 mg | ORAL_TABLET | ORAL | Status: DC | PRN

## 2016-10-04 MED ORDER — FLEET ENEMA 7-19 GM/118ML RE ENEM: 1.0000 | ENEMA | RECTAL | Status: DC | PRN

## 2016-10-04 MED ORDER — LIDOCAINE HCL (PF) 1 % IJ SOLN
INTRAMUSCULAR | Status: DC | PRN
  Administered 2016-10-04 (×2): 5 mL

## 2016-10-04 MED ORDER — PRENATAL MULTIVITAMIN CH
1.0000 | ORAL_TABLET | Freq: Every day | ORAL | Status: DC
  Administered 2016-10-05 – 2016-10-06 (×2): 1 via ORAL
  Filled 2016-10-04 (×2): qty 1

## 2016-10-04 MED ORDER — PHENYLEPHRINE 40 MCG/ML (10ML) SYRINGE FOR IV PUSH (FOR BLOOD PRESSURE SUPPORT)
80.0000 ug | PREFILLED_SYRINGE | INTRAVENOUS | Status: DC | PRN
  Filled 2016-10-04: qty 10
  Filled 2016-10-04: qty 5

## 2016-10-04 MED ORDER — LIDOCAINE HCL (PF) 1 % IJ SOLN
30.0000 mL | INTRAMUSCULAR | Status: DC | PRN
  Filled 2016-10-04: qty 30

## 2016-10-04 MED ORDER — LACTATED RINGERS IV SOLN: 500.0000 mL | INTRAVENOUS | Status: DC | PRN

## 2016-10-04 MED ORDER — ONDANSETRON HCL 4 MG PO TABS: 4.0000 mg | ORAL_TABLET | ORAL | Status: DC | PRN

## 2016-10-04 MED ORDER — OXYCODONE-ACETAMINOPHEN 5-325 MG PO TABS: 2.0000 | ORAL_TABLET | ORAL | Status: DC | PRN

## 2016-10-04 MED ORDER — SENNOSIDES-DOCUSATE SODIUM 8.6-50 MG PO TABS
2.0000 | ORAL_TABLET | ORAL | Status: DC
  Administered 2016-10-04: 2 via ORAL
  Filled 2016-10-04 (×2): qty 2

## 2016-10-04 MED ORDER — OXYTOCIN BOLUS FROM INFUSION
500.0000 mL | Freq: Once | INTRAVENOUS | Status: AC
  Administered 2016-10-04: 500 mL via INTRAVENOUS

## 2016-10-04 MED ORDER — PHENYLEPHRINE 40 MCG/ML (10ML) SYRINGE FOR IV PUSH (FOR BLOOD PRESSURE SUPPORT)
80.0000 ug | PREFILLED_SYRINGE | INTRAVENOUS | Status: DC | PRN
  Filled 2016-10-04: qty 5

## 2016-10-04 MED ORDER — BENZOCAINE-MENTHOL 20-0.5 % EX AERO
1.0000 "application " | INHALATION_SPRAY | CUTANEOUS | Status: DC | PRN
  Administered 2016-10-05: 1 via TOPICAL
  Filled 2016-10-04: qty 56

## 2016-10-04 MED ORDER — ZOLPIDEM TARTRATE 5 MG PO TABS: 5.0000 mg | ORAL_TABLET | Freq: Every evening | ORAL | Status: DC | PRN

## 2016-10-04 MED ORDER — TETANUS-DIPHTH-ACELL PERTUSSIS 5-2.5-18.5 LF-MCG/0.5 IM SUSP: 0.5000 mL | Freq: Once | INTRAMUSCULAR | Status: DC

## 2016-10-04 MED ORDER — DIBUCAINE 1 % RE OINT: 1.0000 "application " | TOPICAL_OINTMENT | RECTAL | Status: DC | PRN

## 2016-10-04 MED ORDER — ONDANSETRON HCL 4 MG/2ML IJ SOLN: 4.0000 mg | INTRAMUSCULAR | Status: DC | PRN

## 2016-10-04 MED ORDER — LACTATED RINGERS IV SOLN
500.0000 mL | Freq: Once | INTRAVENOUS | Status: AC
  Administered 2016-10-04: 500 mL via INTRAVENOUS

## 2016-10-04 MED ORDER — OXYTOCIN 40 UNITS IN LACTATED RINGERS INFUSION - SIMPLE MED
1.0000 m[IU]/min | INTRAVENOUS | Status: DC
  Administered 2016-10-04: 4 m[IU]/min via INTRAVENOUS
  Administered 2016-10-04: 2 m[IU]/min via INTRAVENOUS

## 2016-10-04 MED ORDER — OXYTOCIN 40 UNITS IN LACTATED RINGERS INFUSION - SIMPLE MED
2.5000 [IU]/h | INTRAVENOUS | Status: DC
  Filled 2016-10-04: qty 1000

## 2016-10-04 MED ORDER — COCONUT OIL OIL: 1.0000 "application " | TOPICAL_OIL | Status: DC | PRN

## 2016-10-04 NOTE — Anesthesia Pain Management Evaluation Note (Signed)
  CRNA Pain Management Visit Note  Patient: Cynthia Moore, 21 y.o., female  "Hello [facility] am a [facility]. We have an anesthesia team available at all times to provide care throughout the hospital, including epidural management and anesthesia for C-section. I don't know your plan for the delivery whether it a natural birth, water birth, IV sedation, nitrous supplementation, doula or epidural, but we want to meet your pain goals."   1.Was your pain managed to your expectations on prior hospitalizations?   No prior hospitalizations  2.What is your expectation for pain management during this hospitalization?     Epidural  3.How can we help you reach that goal? Requested epidural, in process  Record the patient's initial score and the patient's pain goal.   Pain: 9  Pain Goal: 10 [facility] wants you to be able to say your pain was always managed very well.  [facility] Center[facility]ERRITT,Willford Rabideau 10/04/2016

## 2016-10-04 NOTE — MAU Note (Signed)
PT SAYS SHE IS AN INDUCTION  FOR 0700

## 2016-10-04 NOTE — Anesthesia Pain Management Evaluation Note (Signed)
  CRNA Pain Management Visit Note  Patient: Cynthia Moore, 21 y.o., female  "Hello [facility]. We have an anesthesia team available at all times to provide care throughout the hospital, including epidural management and anesthesia for C-section. I don't know your plan for the delivery whether it a natural birth, water birth, IV sedation, nitrous supplementation, doula or epidural, but we want to meet your pain goals."   1.Was your pain managed to your expectations on prior hospitalizations?   No prior hospitalizations  2.What is your expectation for pain management during this hospitalization?     Spinal for repeat C-section and open to interventions.  3.How can we help you reach that goal?Be available  Record the patient's initial score and the patient's pain goal.   Pain: 4  Pain Goal: 8 [facility] wants you to be able to say your pain was always managed very well.  Cynthia Moore 10/04/2016

## 2016-10-04 NOTE — Progress Notes (Signed)
Dr Vergie LivingPickens notified of pt's increased temperature.  No further orders

## 2016-10-04 NOTE — Anesthesia Preprocedure Evaluation (Signed)
Anesthesia Evaluation  Patient identified by MRN, date of birth, ID band Patient awake    Reviewed: Allergy & Precautions, H&P , NPO status , Patient's Chart, lab work & pertinent test results  History of Anesthesia Complications Negative for: history of anesthetic complications  Airway Mallampati: II  TM Distance: >3 FB Neck ROM: full    Dental no notable dental hx. (+) Teeth Intact   Pulmonary neg pulmonary ROS,    Pulmonary exam normal breath sounds clear to auscultation       Cardiovascular negative cardio ROS Normal cardiovascular exam Rhythm:regular Rate:Normal     Neuro/Psych negative neurological ROS  negative psych ROS   GI/Hepatic negative GI ROS, Neg liver ROS,   Endo/Other  diabetes, GestationalMorbid obesity  Renal/GU negative Renal ROS  negative genitourinary   Musculoskeletal   Abdominal   Peds  Hematology negative hematology ROS (+)   Anesthesia Other Findings   Reproductive/Obstetrics (+) Pregnancy                             Anesthesia Physical Anesthesia Plan  ASA: III  Anesthesia Plan: Epidural   Post-op Pain Management:    Induction:   PONV Risk Score and Plan:   Airway Management Planned:   Additional Equipment:   Intra-op Plan:   Post-operative Plan:   Informed Consent: I have reviewed the patients History and Physical, chart, labs and discussed the procedure including the risks, benefits and alternatives for the proposed anesthesia with the patient or authorized representative who has indicated his/her understanding and acceptance.     Plan Discussed with:   Anesthesia Plan Comments:         Anesthesia Quick Evaluation  

## 2016-10-04 NOTE — Anesthesia Procedure Notes (Signed)
Epidural Patient location during procedure: OB  Staffing Anesthesiologist: Stellan Vick Performed: anesthesiologist   Preanesthetic Checklist Completed: patient identified, site marked, surgical consent, pre-op evaluation, timeout performed, IV checked, risks and benefits discussed and monitors and equipment checked  Epidural Patient position: sitting Prep: DuraPrep Patient monitoring: heart rate, continuous pulse ox and blood pressure Approach: right paramedian Location: L3-L4 Injection technique: LOR saline  Needle:  Needle type: Tuohy  Needle gauge: 17 G Needle length: 9 cm and 9 Needle insertion depth: 5 cm Catheter type: closed end flexible Catheter size: 20 Guage Catheter at skin depth: 10 cm Test dose: negative  Assessment Events: blood not aspirated, injection not painful, no injection resistance, negative IV test and no paresthesia  Additional Notes Patient identified. Risks/Benefits/Options discussed with patient including but not limited to bleeding, infection, nerve damage, paralysis, failed block, incomplete pain control, headache, blood pressure changes, nausea, vomiting, reactions to medication both or allergic, itching and postpartum back pain. Confirmed with bedside nurse the patient's most recent platelet count. Confirmed with patient that they are not currently taking any anticoagulation, have any bleeding history or any family history of bleeding disorders. Patient expressed understanding and wished to proceed. All questions were answered. Sterile technique was used throughout the entire procedure. Please see nursing notes for vital signs. Test dose was given through epidural needle and negative prior to continuing to dose epidural or start infusion. Warning signs of high block given to the patient including shortness of breath, tingling/numbness in hands, complete motor block, or any concerning symptoms with instructions to call for help. Patient was given  instructions on fall risk and not to get out of bed. All questions and concerns addressed with instructions to call with any issues.     

## 2016-10-04 NOTE — H&P (Signed)
LABOR ADMISSION HISTORY AND PHYSICAL  Cynthia Moore is a 21 y.o. female G1P0 with IUP at 97w6dby [place] for SROM, IOL was planned for 0700 for A1GDM. She reports +FMs, No LOF, no VB, no blurry vision, no headaches, no peripheral edema, and no RUQ pain.  She plans on breast feeding. She requests Nexplanon for birth control.  Dating: By [place]--->  Estimated Date of Delivery: 10/05/16  Sono:   '@[redacted]w[redacted]d'$ , CWD, normal anatomy, cephalic presentation, 31610R 79% EFW  Prenatal History/Complications:  Past Medical History: Past Medical History:  Diagnosis Date  . Gestational diabetes mellitus (GDM) 07/27/2016   1 hour GTT at 357w0d 213. [facility] . Left breast abscess 08/18/2012    Past Surgical History: Past Surgical History:  Procedure Laterality Date  . INCISION AND DRAINAGE ABSCESS Left 08/20/2012   Procedure: INCISION AND DRAINAGE BREAST ABSCESS;  Surgeon: MaImogene BurnTsGeorgette DoverMD;  [facility];  Service: General;  Laterality: Left;    Obstetrical History: OB History    Gravida Para Term Preterm AB Living   1             SAB TAB Ectopic Multiple Live Births                  Social History: Social History   Social History  . Marital status: Single    Spouse name: N/A  . Number of children: N/A  . Years of education: N/A   Social History Main Topics  . Smoking status: Never Smoker  . Smokeless tobacco: Never Used  . Alcohol use No  . Drug use: No  . Sexual activity: Not Currently   Other Topics Concern  . None   Social History Narrative  . None    Family History: History reviewed. No pertinent family history.  Allergies: No Known Allergies  Prescriptions Prior to Admission  Medication Sig Dispense Refill Last Dose  . Prenatal MV & Min w/FA-DHA (PRENATAL ADULT GUMMY/DHA/FA) 0.4-25 MG CHEW Chew 1 tablet by mouth 2 (two) times daily.   10/04/2016 at Unknown time  . Blood Glucose Monitoring Suppl (ONETOUCH VERIO FLEX SYSTEM) w/Device KIT 1  Device by Does not apply route daily before breakfast. 1 kit 0 Taking     Review of Systems   All systems reviewed and negative except as stated in HPI  Blood pressure 125/83, pulse 90, temperature 98.1 F (36.7 C), temperature source Oral, resp. rate 18. General appearance: alert, cooperative and no distress Lungs: normal work of breathing Extremities: Homans sign is negative, no sign of DVT Presentation: cephalic Fetal monitoringBaseline: 130 bpm, Variability: Good {> 6 bpm), Accelerations: Reactive and Decelerations: Absent Uterine activityFrequency: Every 6 minutes     Prenatal labs: ABO, Rh: A/POS/-- (02/26 1116) Antibody: NEG (02/26 1116) Rubella: Non-immune RPR: Non Reactive (05/14 1655)  HBsAg: NEGATIVE (02/26 1116)  HIV: NONREACTIVE (02/26 1116)  GBS:  negative  Glucola: 1 hr- 155  Prenatal Transfer Tool  Maternal Diabetes: Yes:  Diabetes Type:  Diet controlled Genetic Screening: Declined Maternal Ultrasounds/Referrals: Normal Fetal Ultrasounds or other Referrals:  None Maternal Substance Abuse:  No Significant Maternal Medications:  None Significant Maternal Lab Results: Lab values include: Group B Strep negative  Results for orders placed or performed during the hospital encounter of 10/04/16 (from the past 24 hour(s))  POCT fern test   Collection Time: 10/04/16  5:50 AM  Result Value Ref Range   POCT Fern Test Positive = ruptured amniotic  [place]     Patient Active Problem List   Diagnosis Date Noted  . Obesity in pregnancy, antepartum, third trimester 09/12/2016  . Obesity (BMI 30-39.9) 09/12/2016  . Gestational diabetes mellitus (GDM) 07/27/2016  . Supervision of high risk pregnancy, antepartum 07/27/2016    Assessment: Cynthia Moore is a 21 y.o. G1P0 at 25w6dhere for SROM, IOL of labor planned for 0700 for A1GDM.  #Labor: Induction protocol- Start the patient on pitocin #Pain: Epidural upon request  #FWB: Cat 1 #ID: GBS neg #MOF:  breast #MOC:nexplanon #Circ:  Outpatient #A1GDM: CBG q4  JMartiniqueShirley, DO Family Medicine Resident PGY-1  10/04/2016, 6:07 AM  CNM attestation:  I have seen and examined this patient; I agree with above documentation in the resident's note.   Cynthia MOFIELDis a 21y.o. G1P0 here for SROM, just prior to her scheduled IOL for GDMA1  PE: BP 125/68 (BP Location: Left Arm)   Pulse 69   Temp 97.8 F (36.6 C) (Oral)   Resp 20  Gen: calm comfortable, NAD Resp: normal effort, no distress Abd: gravid  ROS, labs, PMH reviewed  Plan: [facility] to [facility] [facility] [place], so will management expectantly for now GBS neg Anticipate SVD  SHAW, KIMBERLY CNM 10/04/2016, 9:08 AM

## 2016-10-05 DIAGNOSIS — O24429 Gestational diabetes mellitus in childbirth, unspecified control: Secondary | ICD-10-CM

## 2016-10-05 DIAGNOSIS — Z3A39 39 weeks gestation of pregnancy: Secondary | ICD-10-CM

## 2016-10-05 LAB — CBC
HEMATOCRIT: 30.4 % — AB (ref 36.0–46.0)
HEMOGLOBIN: 9.9 g/dL — AB (ref 12.0–15.0)
MCH: 27.3 pg (ref 26.0–34.0)
MCHC: 32.6 g/dL (ref 30.0–36.0)
MCV: 83.7 fL (ref 78.0–100.0)
Platelets: 141 10*3/uL — ABNORMAL LOW (ref 150–400)
RBC: 3.63 MIL/uL — ABNORMAL LOW (ref 3.87–5.11)
RDW: 14.6 % (ref 11.5–15.5)
WBC: 10.9 10*3/uL — ABNORMAL HIGH (ref 4.0–10.5)

## 2016-10-05 NOTE — Progress Notes (Signed)
CSW received consult for assistance applying for Medicaid.  Hospital Financial Counselor assists with this.  CSW contacted [facility]. [place] who states she has already met with patient.  CSW screening out referral.

## 2016-10-05 NOTE — Anesthesia Postprocedure Evaluation (Signed)
Anesthesia Post Note  Patient: Cynthia Moore  Procedure(s) Performed: * No procedures listed *     Patient location during evaluation: Mother Baby Anesthesia Type: Epidural Level of consciousness: awake and alert and oriented Pain management: satisfactory to patient Vital Signs Assessment: post-procedure vital signs reviewed and stable Respiratory status: spontaneous breathing and nonlabored ventilation Cardiovascular status: stable Postop Assessment: no headache, no backache, no signs of nausea or vomiting, adequate PO intake and patient able to bend at knees (patient up walking) Anesthetic complications: no    Last Vitals:  Vitals:   10/04/16 2200 10/05/16 0204  BP: (!) 117/59 (!) 120/54  Pulse: 88 81  Resp: 18 18  Temp: 37.7 C 37.2 C    Last Pain:  Vitals:   10/05/16 0550  TempSrc:   PainSc: 0-No pain   Pain Goal:                 [name],[name]

## 2016-10-05 NOTE — Lactation Note (Signed)
This note was copied from a baby's chart. Lactation Consultation Note New mom BF in cradle position. Discussed positions and need of support and comfort during feedings. Mom had baby BF on his back turing head towards mom. Had offered to assist before latching, mom stated she could do. Reposition baby towards mom. Encouraged to BF STS. Mom has "V" shaped breast w/wide space between breast. Breast and chest tissue doesn't touch breast bone, has a possible connective tissue appearance.  Mom has an abscess at age of 21. She got hit w/softball and turned into abscess, having to have surgery and some breast tissue removed. May because of appearance between breast.? Mom has large everted nipples. Hand expressed colostrum noted.  Newborn behavior and feeding habits, I&O, STS, and cluster feeding explained. Mom encouraged to feed baby 8-12 times/24 hours and with feeding cues. Encouraged to wake baby if hasn't cued to BF in 3 hrs.  Encouraged to call for assistance or questions. Left information sheet of outside resources.  Patient Name: Cynthia Moore [id number]'s Date: 10/05/2016 Reason for consult: Initial assessment   Maternal Data Has patient been taught Hand Expression?: Yes Does the patient have breastfeeding experience prior to this delivery?: No  Feeding Feeding Type: Breast Fed Length of feed: 5 min (still BF)  LATCH Score Latch: Repeated attempts needed to sustain latch, nipple held in mouth throughout feeding, stimulation needed to elicit sucking reflex.  Audible Swallowing: A few with stimulation  Type of Nipple: Everted at rest and after stimulation  Comfort (Breast/Nipple): Soft / non-tender  Hold (Positioning): Assistance needed to correctly position infant at breast and maintain latch.  LATCH Score: 7  Interventions Interventions: Breast feeding basics reviewed;Skin to skin;Hand express;Breast compression;Adjust position;Position options  Lactation Tools Discussed/Used      Consult Status Consult Status: Follow-up Date: 10/05/16 (in pm) Follow-up type: In-patient    Maleak Brazzel, Diamond NickelLAURA G 10/05/2016, 5:05 AM

## 2016-10-05 NOTE — Lactation Note (Signed)
This note was copied from a baby's chart. Lactation Consultation Note  Patient Name: Cynthia Moore ZOXWR'UToday's Date: 10/05/2016 Reason for consult: Follow-up assessment Baby at 15 hr of life. Dad reports baby was very fussy over night. Mom stated she tried to latch him but he was sleepy. There was a pacifier on the bedside table. Mom stated "the RN told us not to use it yet because he would get confused". Mom denies breast or nipple pain. She is concerned about supply. Demonstrated manual expression, large drops of colostrum were easily expressed, spoon in room. Baby has a heart shaped tongue tip. He can lift tongue to midline, extend tongue over gum ridge, can cup tongue, has some lateralization of tongue, and nice peristolic tongue movement. He does bite down on a gloved finger a few times before settling into a nice rhythm. Talked with parents about starting suck training and tummy time. Discussed baby behavior, feeding frequency, baby belly size, voids, wt loss, breast changes, and nipple care. Parents are aware of lactation services and support group. Mom will offer the breast on demand, post express, and spoon feed per volume guidelines as needed.      Maternal Data    Feeding Feeding Type: Breast Fed Length of feed: 5 min  LATCH Score Latch: Repeated attempts needed to sustain latch, nipple held in mouth throughout feeding, stimulation needed to elicit sucking reflex.  Audible Swallowing: None  Type of Nipple: Everted at rest and after stimulation  Comfort (Breast/Nipple): Soft / non-tender  Hold (Positioning): Full assist, staff holds infant at breast  LATCH Score: 5  Interventions Interventions: Breast feeding basics reviewed;Assisted with latch;Adjust position;Support pillows;Position options;Expressed milk  Lactation Tools Discussed/Used     Consult Status Consult Status: Follow-up Date: 10/06/16 Follow-up type: In-patient    [name] 10/05/2016, 10:59  AM

## 2016-10-05 NOTE — Progress Notes (Signed)
POSTPARTUM PROGRESS NOTE  Post Partum Day 1 Subjective: Cynthia Moore is a 21 y.o. G1P1001 157w6d s/p svd.  No acute events overnight.  Pt denies problems with ambulating, voiding or po intake.  She denies nausea or vomiting.  Pain is well controlled.  She has had flatus. She has had bowel movement.  Lochia Small.   Objective: Blood pressure (!) 120/54, pulse 81, temperature 98.9 F (37.2 C), temperature source Oral, resp. rate 18, height 5\' 4"  (1.626 m), weight 106.1 kg (234 lb), SpO2 99 %, unknown if currently breastfeeding.  Physical Exam:  General: alert, cooperative and no distress Lochia:normal flow Chest: CTAB Heart: RRR no m/r/g Abdomen: +BS, soft, nontender,  Uterine Fundus: firm DVT Evaluation: No calf swelling or tenderness Extremities: trace edema   Recent Labs  10/04/16 0600 10/05/16 0509  HGB 12.2 9.9*  HCT 37.8 30.4*    Assessment/Plan:  ASSESSMENT: Cynthia Moore is a 21 y.o. G1P1001 397w6d s/p svd  Plan for discharge tomorrow, pt needs to talk to social work for emergency medicaid, and they are entertaining the idea of paying cash to have an inpt circ done for baby boy. Still undecided on the latter.    LOS: 1 day   Sullivan LoneBrannon L Inman, Medical Student   OB FELLOW MEDICAL STUDENT NOTE ATTESTATION  I confirm that I have verified the information documented in the medical student's note and that I have also personally performed the physical exam and all medical decision making activities. PPD#1, doing well Contraception: Nexplanon Will do circumcision outpatient D/C home tomorrow   Frederik PearJulie P Degele, MD OB Fellow 10/05/2016, 10:38 AM

## 2016-10-06 DIAGNOSIS — Z3A39 39 weeks gestation of pregnancy: Secondary | ICD-10-CM

## 2016-10-06 DIAGNOSIS — O24429 Gestational diabetes mellitus in childbirth, unspecified control: Secondary | ICD-10-CM

## 2016-10-06 NOTE — Discharge Instructions (Signed)

## 2016-10-06 NOTE — Discharge Summary (Signed)
OB Discharge Summary     Patient Name: Cynthia Moore DOB: [birth year] MRN: [id number]  Date of admission: 10/04/2016 Delivering MD: Katheren Shams   Date of discharge: 10/06/2016  Admitting diagnosis: 45 WEEKS ROM CTX Intrauterine pregnancy: [redacted]w[redacted]d    Secondary diagnosis:  Active Problems:   Normal labor  Additional problems:  Patient Active Problem List   Diagnosis Date Noted  . Normal labor 10/04/2016  . Obesity in pregnancy, antepartum, third trimester 09/12/2016  . Obesity (BMI 30-39.9) 09/12/2016  . Gestational diabetes mellitus (GDM) 07/27/2016  . Supervision of high risk pregnancy, antepartum 07/27/2016     Discharge diagnosis: Term Pregnancy Delivered and GDM A1                                                                                                Post partum procedures:none  Augmentation: Pitocin  Complications: None  Hospital course:  Onset of Labor With Vaginal Delivery     21y.o. yo G1P1001 at 35w6das admitted in Active Labor on 10/04/2016. Patient had an uncomplicated labor course as follows:  Membrane Rupture Time/Date: 5:00 PM ,10/04/2016   Intrapartum Procedures: Episiotomy: None [1]                                         Lacerations:  1st degree [2];Labial [10]  Patient had a delivery of a Viable infant. 10/04/2016  Information for the patient's newborn:  RaSanaa, Zilberman0[[id number]]Delivery Method: Vaginal, Spontaneous Delivery (Filed from Delivery Summary)    Pateint had an uncomplicated postpartum course.  She is ambulating, tolerating a regular diet, passing flatus, and urinating well. Patient is discharged home in stable condition on 10/06/16.   Physical exam  Vitals:   10/05/16 0204 10/05/16 0927 10/05/16 1802 10/06/16 0521  BP: (!) 120/54 (!) 101/50 126/82 119/72  Pulse: 81 79 97 91  Resp: _0 Temp: 98.9 F (37.2 C) 98 F (36.7 C) 98.3 F (36.8 C) 98.1 F (36.7 C)  TempSrc: Oral Oral Oral Oral  SpO2: 99%   99%   Weight:      Height:       General: alert, cooperative and no distress Lochia: appropriate Uterine Fundus: firm DVT Evaluation: No evidence of DVT seen on physical exam. Labs: Lab Results  Component Value Date   WBC 10.9 (H) 10/05/2016   HGB 9.9 (L) 10/05/2016   HCT 30.4 (L) 10/05/2016   MCV 83.7 10/05/2016   PLT 141 (L) 10/05/2016   CMP Latest Ref Rng & Units 10/04/2016  Glucose 65 - 99 mg/dL 85  BUN 6 - 20 mg/dL 11  Creatinine 0.44 - 1.00 mg/dL 0.49  Sodium 135 - 145 mmol/L 135  Potassium 3.5 - 5.1 mmol/L 3.7  Chloride 101 - 111 mmol/L 107  CO2 22 - 32 mmol/L 18(L)  Calcium 8.9 - 10.3 mg/dL 9.2  Total Protein 6.5 - 8.1 g/dL 6.3(L)  Total Bilirubin 0.3 - 1.2 mg/dL 0.3  Alkaline  Phos 38 - 126 U/L 157(H)  AST 15 - 41 U/L 16  ALT 14 - 54 U/L 9(L)    Discharge instruction: per After Visit Summary and "Baby and Me Booklet".  After visit meds:  Allergies as of 10/06/2016   No Known Allergies     Medication List    TAKE these medications   ONETOUCH VERIO FLEX SYSTEM w/Device Kit 1 Device by Does not apply route daily before breakfast.   Prenatal Adult Gummy/DHA/FA 0.4-25 MG Chew Chew 1 tablet by mouth 2 (two) times daily.       Diet: carb modified diet  Activity: Advance as tolerated. Pelvic rest for 6 weeks.   Outpatient follow up:6 weeks Follow-up [place] [facility]. Schedule an appointment as soon as possible for a visit.   Specialty:  Obstetrics and Gynecology Why:  Post-partum visit Contact information: [place] [phone]          Postpartum contraception: Nexplanon  Newborn Data: Live born female  Birth Weight: 8 lb 7.3 oz (3835 g) APGAR: 9, 9  Baby Feeding: Breast Disposition:home with mother  Luiz Blare, DO OB Fellow [facility], [place] 10/06/2016, 8:59 AM

## 2016-10-07 ENCOUNTER — Ambulatory Visit: Payer: Self-pay

## 2016-10-07 NOTE — Lactation Note (Signed)
This note was copied from a baby's chart. Lactation Consultation Note  Patient Name: Boy Rosalia HammersYesica Lalanne [id number]'JToday's Date: 10/07/2016 Reason for consult: Follow-up assessment;Infant weight loss;Other (Comment) (10 % weiight loss )  Baby is 7162 hours old and has been exclusively breast fed, today has 10 % weight loss.  Amount of voids and stools represent the weight loss.  Both mom and dad mentioned Friday night baby cluster fed a lot , last night more satisfied and increased  Swallows.  @ consultant worked on positioning and depth at the breast. Multiple swallows noted, increased with breast compressions And per mom comfortable . Baby fed 20 mins,nipple well rounded when the  Baby released and baby satisfied sleeping.  LC reviewed options for feedings STS until the baby is back up to birth weight,  Change positions at least between 2 .  Obtain the depth at the breast as shown ,  Nutritive vs non- nutritive feeding discussed.  34F SNS instructions given for when mom obtains EBM pumping and hand expressing.  Mom denies soreness. Sore nipple and engorgement prevention and tx reviewed.  LC instructed mom on the use hand pump and DEBP [facility]  [facility].00 [facility]. [facility] .  Per mom wasn't sure if she would obtain [facility] , may buy a DEBP, but would returned the DEBP  As scheduled.  Mother informed of post-discharge support and given phone number to the lactation department, including services for phone call assistance; out-patient appointments; and breastfeeding support group. List of other breastfeeding resources in the community given in the handout. Encouraged mother to call for problems or concerns related to breastfeeding.   Maternal Data Has patient been taught Hand Expression?: Yes  Feeding Feeding Type: Breast Fed Length of feed: 20 min (SWALLOWS NOTED )  LATCH Score Latch: Grasps breast easily, tongue down, lips flanged, rhythmical  sucking.  Audible Swallowing: Spontaneous and intermittent  Type of Nipple: Everted at rest and after stimulation  Comfort (Breast/Nipple): Filling, red/small blisters or bruises, mild/mod discomfort  Hold (Positioning): Assistance needed to correctly position infant at breast and maintain latch.  LATCH Score: 8  Interventions Interventions: Breast feeding basics reviewed;DEBP;Shells;Hand pump  Lactation Tools Discussed/Used Tools: Shells;34F feeding tube / Syringe;Pump Shell Type: Inverted Breast pump type: Double-Electric Breast Pump;Manual WIC Program: Yes Pump Review: Setup, frequency, and cleaning Initiated by:: MAI  Date initiated:: 10/07/16   Consult Status Consult Status: Complete Date: 10/07/16 Follow-up type: In-patient    Matilde SprangMargaret Ann Hesham Womac 10/07/2016, 10:03 AM

## 2016-10-15 ENCOUNTER — Encounter: Payer: Self-pay | Admitting: General Practice

## 2016-11-15 ENCOUNTER — Encounter: Payer: Self-pay | Admitting: Advanced Practice Midwife

## 2016-11-15 ENCOUNTER — Ambulatory Visit (INDEPENDENT_AMBULATORY_CARE_PROVIDER_SITE_OTHER): Payer: Self-pay | Admitting: Advanced Practice Midwife

## 2016-11-15 ENCOUNTER — Ambulatory Visit: Payer: No Typology Code available for payment source | Admitting: Advanced Practice Midwife

## 2016-11-15 DIAGNOSIS — Z30018 Encounter for initial prescription of other contraceptives: Secondary | ICD-10-CM

## 2016-11-15 MED ORDER — NORETHINDRONE 0.35 MG PO TABS: 1.0000 | ORAL_TABLET | Freq: Every day | ORAL | 11 refills | Status: DC

## 2016-11-15 NOTE — Patient Instructions (Addendum)
Fact Sheet: Progestin-Only/ Mini-Pill            How Does The Mini-Pill Work? Marland Kitchen. The mini-pill contains a hormone like the ones your body makes. It works by making the mucus in your cervix too thick for sperm to pass through. If sperm cannot reach the egg, you cannot get pregnant. . No method of birth control is 100% effective. If you take all of your birth control pills on time, they are 99% effective. If you skip some pills, they are 91% effective.  How Do I Start The Mini-Pill? Marland Kitchen. There are 2 ways to start the pill:   Quick Start: Take your first pill as soon as you get the pack.   Next period: Take your first pill soon after your next period begins.  . If you take your first pill up to 5 days after the start of your period, you are protected against pregnancy right away.  . If you take your first pill more than 5 days after the start of your period, you should use condoms or spermicide for the first 2 days.  How Do I Use The Mini-Pill? Marland Kitchen. Once you start using the pill, take 1 pill each day. Take your pill at the same time each day. . After you finish a pack of pills, you should start a new pack the next day. You should have NO day without a pill.  What If I Miss Mini-Pills? . I forgot ONE pill:  Take your pill as soon as you can. If you take your pill more than 3 hours late, use condoms or spermicide for the next 2 days. . I forgot TWO pills or more: Take your pill as soon as you can. Take your next pill at the usual time. Use condoms or spermicide for the next 2 days. Use emergency contraception (EC) if you have unprotected vaginal sex.  What If I Stopped Taking The Mini-Pill And Had Unprotected Vaginal Sex?  . Take Emergency Contraception (EC) right away.  EC can prevent pregnancy up to 5 days after sex, and it works better the sooner you take it.  How Does The Mini-Pill Help Me? Marland Kitchen. The mini-pill is safe and effective birth control. The mini-pill is safe for you to use while breastfeeding.    . The mini-pill has no effect on your ability to get pregnant in the future, after you stop taking it.    How Will I Feel On The Mini-Pill? Marland Kitchen. You will feel about the same. You may notice changes in your periods.  You may have spotting or no period at all. This is normal. You may have nausea, spotting, weight change, and/or breast pain. These problems often go away after 2-3 months.  Does The Mini-Pill Have Risks? Marland Kitchen. The mini-pill is very safe. **Remember, the mini-pill does not protect you from Sexually Transmitted Infections or HIV. Always use condoms to protect yourself!**     Schedule a pap as soon as possible  [phone]  Please call this number and ask to be connected to the FREE PAP program. They will connect you to where you need to go.

## 2016-11-15 NOTE — Progress Notes (Signed)
Entered in error  Cynthia ShellerHeather Susana Moore 12:03 PM 11/15/16

## 2016-11-15 NOTE — Progress Notes (Signed)
Subjective

## 2016-11-15 NOTE — Progress Notes (Signed)
Subjective:     Patient ID: Cynthia Moore, female   DOB: March 28, 1995, 21 y.o.   MRN: [id number]  Cynthia Moore is a 21 y.o. G1P1001 who is S/P NSVD on 10/04/16. She had a 1st degree labial laceration with repair. She reports that her perineum feels like it has healed well. Breastfeeding is going well. She denies any breast pain or problems. She denies any abdominal pain. She reports that her bleeding has stopped. She has not resumed intercourse at this time. She is interested in OCPs or Nexplanon of birth control. She declines flu shot today. TDAP up to date. Pap is due, but patient does not have insurance, [facility]. She feels as though her moods are adjusting well. She reports feeling overwhelmed on occasion, but it has not impacted her day to day living.      Review of Systems  Constitutional: Negative for chills and fever.  Gastrointestinal: Negative for constipation (last BM 11/14/16), nausea and vomiting.  Genitourinary: Negative for dysuria, frequency, pelvic pain and vaginal bleeding.      Objective:   Physical Exam  Constitutional: She is oriented to person, place, and time. She appears well-developed and well-nourished. No distress.  HENT:  Head: Normocephalic.  Cardiovascular: Normal rate.   Pulmonary/Chest: Effort normal. Right breast exhibits no inverted nipple, no mass, no nipple discharge, no skin change and no tenderness. Left breast exhibits no inverted nipple, no mass, no nipple discharge, no skin change and no tenderness.  Abdominal: Soft. There is no tenderness. There is no rebound.  Neurological: She is alert and oriented to person, place, and time.  Skin: Skin is warm and dry.  Psychiatric: She has a normal mood and affect.  Nursing note and vitals reviewed.      Assessment:     1. Postpartum care and examination   2. Encounter for initial prescription of other contraceptives        Plan:     -Routine care [facility] -Mini-pill rx sent  to pharmacy -Declines flu vax -Up to date on TDaP   Thressa ShellerHeather Tee Richeson 11:36 AM 11/15/16

## 2016-11-22 ENCOUNTER — Telehealth: Payer: Self-pay | Admitting: Family Medicine

## 2016-11-22 MED ORDER — NORETHINDRONE 0.35 MG PO TABS: 1.0000 | ORAL_TABLET | Freq: Every day | ORAL | 11 refills | Status: AC

## 2016-11-22 NOTE — Telephone Encounter (Signed)
Call returned to pt and informed her that the [facility] on [address] has the pills that she needs. I asked if I could send her Rx to that location and she agreed. Rx e-prescribed.

## 2016-11-22 NOTE — Telephone Encounter (Signed)
Patient call to say [facility] was not able to fill her Rx because it was no longer available. Would like to get something for birth control.

## 2017-12-03 ENCOUNTER — Telehealth: Payer: Self-pay | Admitting: Family Medicine

## 2017-12-03 NOTE — Telephone Encounter (Signed)
Made appt with pt

## 2017-12-24 ENCOUNTER — Ambulatory Visit: Payer: Self-pay | Admitting: Family Medicine

## 2017-12-24 NOTE — Progress Notes (Deleted)
   Subjective:   Patient ID: Cynthia Moore    DOB: 04-19-1995, 22 y.o. female   MRN: [id number]  CC: health maintenance  HPI: Cynthia Moore is a 22 y.o. female who presents to clinic today for the following issue.    ROS: See HPI for pertinent ROS.  Social:   Medications reviewed. Objective:   There were no vitals taken for this visit. Vitals and nursing note reviewed.  General: well nourished, well developed, in no acute distress with non-toxic appearance HEENT: NCAT, EOMI, PERRL, MMM, oropharynx clear Neck: supple, non-tender, normal ROM, no LAD  CV: RRR no MRG  Lungs: CTAB, normal effort  Abdomen: soft, NTND, no masses or organomegaly, +bs  Skin: warm, dry, no rashes or lesions, cap refill < 2 seconds Extremities: warm and well perfused, normal tone Neuro: alert, oriented x3, no focal deficits   Assessment & Plan:   No problem-specific Assessment & Plan notes found for this encounter.  No orders of the defined types were placed in this encounter.  No orders of the defined types were placed in this encounter.   Freddrick March, MD [facility], PGY-3 12/24/2017 8:45 AM

## 2019-06-24 ENCOUNTER — Ambulatory Visit (INDEPENDENT_AMBULATORY_CARE_PROVIDER_SITE_OTHER): Payer: Self-pay

## 2019-06-24 ENCOUNTER — Telehealth (INDEPENDENT_AMBULATORY_CARE_PROVIDER_SITE_OTHER): Payer: Commercial Managed Care - POS | Admitting: Nurse Practitioner

## 2019-06-24 NOTE — Telephone Encounter (Signed)
PROVIDER ACTION REQUESTED: no  Action item needed:  no  Appt scheduled:   Future Appointments   Date Time Provider [place]   06/24/2019  6:45 PM [place] [place]   09/28/2019  8:20 AM Orvan July, MD [facility] [address]       Chief Complaint  Nasal allergies x chronic    Assessment details:  Pt reports hx of allergies, sinus congestion, HA, watery eyes, runny nose triggered by pollen. Recently moved from [place], state a family friend physician would give her kenalog 40 mg inj for allergy relief (did not have insurance at that time). States OTC medications do not work for her sx.  Denies coughing or difficulty breathing, fever.   Co-pay for urgent care is $70 per pt, advised she can start with a virtual visit to discuss treatment options. Pt agrees to plan. No appts in [place] clinic.       CLINIC/RN/LVN ACTION REQUEST: NO, FYI Only  Action item needed: NO, FYI Only    Scheduled pt [facility] appt, pt also sent [facility] instructions.  and pt given strict ED precautions and reviewed all applicable Home Care Advice per protocol.     COVID TRIAGE PROTOCOL: NEGATIVE   Symptoms w/in the last 14 days: fever or chills, cough, sob/diff breathing, fatigue, muscle or body aches, headache, loss of taste or smell, sore throat, congestion or runny nose, nausea and vomiting, diarrhea.   Questions:   1. Looking back to the last 14 days, have you been informed by a public health agency or a healthcare agency or a healthcare system that you have been exposed to [place] (COVID-19)  2. Looking back to the last 14 days, has a person in your household been diagnosed with Coronavirus (COVID-19) infection       Reason for Call: Allergies     Disposition: Data Unavailable     Reason for Disposition   [1] Nasal allergies AND [2] year-round symptoms    Additional Information   Negative: [1] Wheezing (high pitched whistling sound) AND [2] previous asthma attacks or use of asthma medicines   Negative: [1]  Wheezing (high pitched whistling sound) AND [2] no history of asthma   Negative: Eye redness and itching are the only symptoms   Negative: Doesn't match the SYMPTOMS for [place] Fever   Negative: Patient sounds very sick or weak to the triager   Negative: Lots of coughing   Negative: [1] Lots of yellow or green discharge from nose AND [2] present > 3 days   Negative: [1] Taking antihistamines > 2 days AND [2] nasal allergy symptoms interfere with sleep, school, or work    Answer Assessment - Initial Assessment Questions  1. SYMPTOM: "What's the main symptom you're concerned about?" (e.g., runny nose, stuffiness, sneezing, itching)      Hx of allergies,  .     2. SEVERITY: "How bad is it?" "What does it keep you from doing?" (e.g., sleeping, working)       Minus HA and congestion  3. EYES: "Are the eyes also red, watery, and itchy?"       Yes, watery swollen eyes  4. TRIGGER: "What pollen or other allergic substance do you think is causing the symptoms?"       Pollen   5. TREATMENT: "What medicine are you using?" "What medicine worked best in the past?"      kenalog 40mg  inj is what she normally would received. Has tried OTC medications w./ no  relief.      6. OTHER SYMPTOMS: "Do you have any other symptoms?" (e.g., coughing, difficulty breathing, wheezing)      Denies coughing or difficulty breathing, fever.     7. PREGNANCY: "Is there any chance you are pregnant?" "When was your last menstrual period?"      Denies    Protocols used: NASAL ALLERGIES (HAY FEVER)-A-AH

## 2019-06-24 NOTE — Telephone Encounter (Signed)
Symptom Call        Next office visit:  09/28/19  What symptom is the patient experiencing? Seasonal allergies- Patient experiencing headache, runny nose, swollen eyes and congestions. Pt states she has allergies to pollen and typically gets a kenolog injection ([place] due to OTC medications not being effective.   Please advise.   Is this a new or ongoing symptom? new  Estimated time since experiencing symptom(s)? 1 week  Is this symptom complaint the result of a fall or injury? no    Name of PCP Provider: No Pcp, Per Patient   Insurance Coverage Verified: Active- in network  Last office visit: Visit date not found    Who is reporting the symptoms? Incoming call from patient    Best way to contact patient: [phone] (mobile)   Alternative communication method:na        Encounter created by Care Assist MA.  If further action required please route encounter to appropriate in clinic MA/LVN/Resident pool.      Cold transfer to triage nurse to extension #[phone]

## 2019-09-28 ENCOUNTER — Encounter (INDEPENDENT_AMBULATORY_CARE_PROVIDER_SITE_OTHER): Payer: Commercial Managed Care - POS | Admitting: Internal Medicine
